# Patient Record
Sex: Female | Born: 1935 | Race: Black or African American | Hispanic: No
Health system: Southern US, Community
[De-identification: ages and names within clinical notes are randomized; demographics above are authoritative.]

---

## 2002-07-02 ENCOUNTER — Emergency Department (HOSPITAL_COMMUNITY): Admission: EM | Admit: 2002-07-02 | Discharge: 2002-07-02 | Payer: Self-pay | Admitting: *Deleted

## 2002-07-02 ENCOUNTER — Encounter: Payer: Self-pay | Admitting: *Deleted

## 2002-07-07 ENCOUNTER — Inpatient Hospital Stay (HOSPITAL_COMMUNITY): Admission: EM | Admit: 2002-07-07 | Discharge: 2002-07-10 | Payer: Self-pay | Admitting: Emergency Medicine

## 2002-07-07 ENCOUNTER — Encounter: Payer: Self-pay | Admitting: Emergency Medicine

## 2003-01-08 ENCOUNTER — Ambulatory Visit (HOSPITAL_COMMUNITY): Admission: RE | Admit: 2003-01-08 | Discharge: 2003-01-08 | Payer: Self-pay | Admitting: Internal Medicine

## 2003-11-07 ENCOUNTER — Encounter (HOSPITAL_COMMUNITY): Admission: RE | Admit: 2003-11-07 | Discharge: 2003-12-07 | Payer: Self-pay | Admitting: Internal Medicine

## 2004-09-01 ENCOUNTER — Emergency Department (HOSPITAL_COMMUNITY): Admission: EM | Admit: 2004-09-01 | Discharge: 2004-09-01 | Payer: Self-pay | Admitting: *Deleted

## 2004-10-15 ENCOUNTER — Ambulatory Visit (HOSPITAL_COMMUNITY): Admission: RE | Admit: 2004-10-15 | Discharge: 2004-10-15 | Payer: Self-pay | Admitting: Internal Medicine

## 2004-12-03 ENCOUNTER — Encounter: Admission: RE | Admit: 2004-12-03 | Discharge: 2004-12-03 | Payer: Self-pay | Admitting: Internal Medicine

## 2005-05-03 ENCOUNTER — Emergency Department (HOSPITAL_COMMUNITY): Admission: EM | Admit: 2005-05-03 | Discharge: 2005-05-04 | Payer: Self-pay | Admitting: Emergency Medicine

## 2006-09-24 ENCOUNTER — Emergency Department (HOSPITAL_COMMUNITY): Admission: EM | Admit: 2006-09-24 | Discharge: 2006-09-24 | Payer: Self-pay | Admitting: Emergency Medicine

## 2006-09-27 ENCOUNTER — Ambulatory Visit: Payer: Self-pay | Admitting: Internal Medicine

## 2006-09-27 ENCOUNTER — Inpatient Hospital Stay (HOSPITAL_COMMUNITY): Admission: EM | Admit: 2006-09-27 | Discharge: 2006-09-30 | Payer: Self-pay | Admitting: Emergency Medicine

## 2006-10-05 ENCOUNTER — Ambulatory Visit (HOSPITAL_COMMUNITY): Admission: RE | Admit: 2006-10-05 | Discharge: 2006-10-05 | Payer: Self-pay | Admitting: Gastroenterology

## 2006-10-05 ENCOUNTER — Encounter: Payer: Self-pay | Admitting: Gastroenterology

## 2006-10-05 ENCOUNTER — Ambulatory Visit: Payer: Self-pay | Admitting: Gastroenterology

## 2008-05-25 IMAGING — CT CT ABDOMEN W/ CM
2 of 6 series · 17 of 46 positions shown, 19 images · IV contrast (omnipaque)
Comparison: None.

CLINICAL DATA: Mid and low abdominal and pelvic pain for approximately six days.  Anemia.  
 ABDOMEN CT WITH CONTRAST:
TECHNIQUE: Multidetector CT imaging of the abdomen was performed following the standard protocol during bolus administration of intravenous contrast.
 Contrast:  120 cc Omnipaque 300.
TECHNIQUE: Multidetector CT imaging of the pelvis was performed following the standard protocol during bolus administration of intravenous contrast.

[Series 3: abd_pel xxl 1.5 b20f · axial · 0.77mm/px · z∈[-408,-10]mm · 14 of 438 slices shown, 16 images]
[im 20/438  soft-tissue]
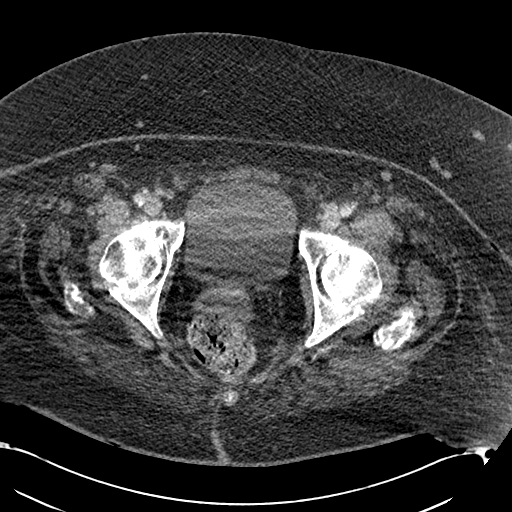
[im 20/438  bone]
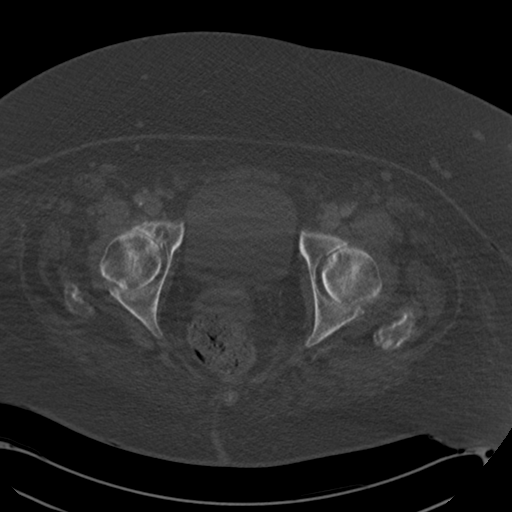
[im 60/438  soft-tissue]
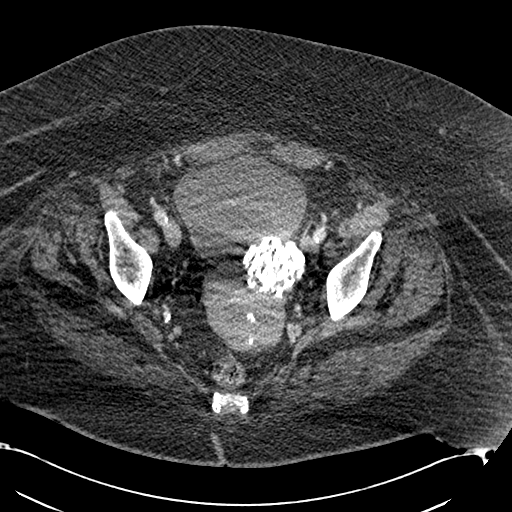
[im 80/438  soft-tissue]
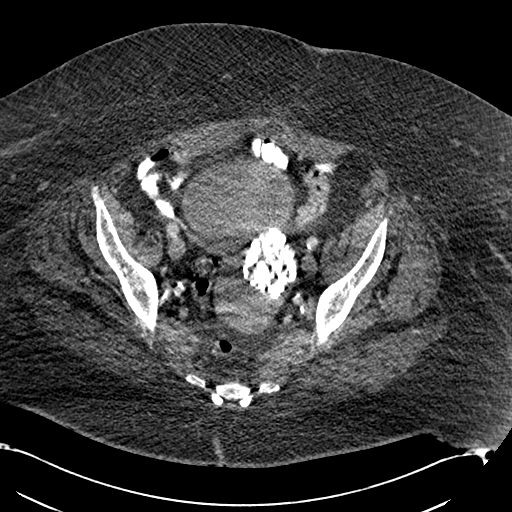
[im 120/438  soft-tissue]
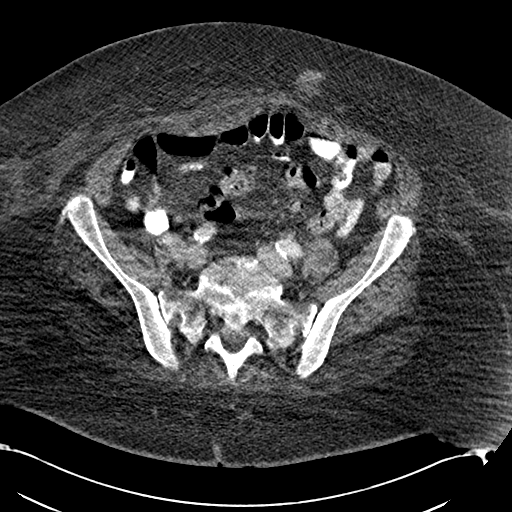
[im 140/438  soft-tissue]
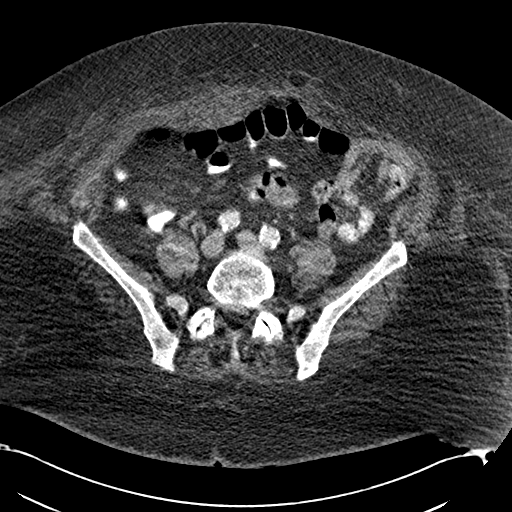
[im 179/438  soft-tissue]
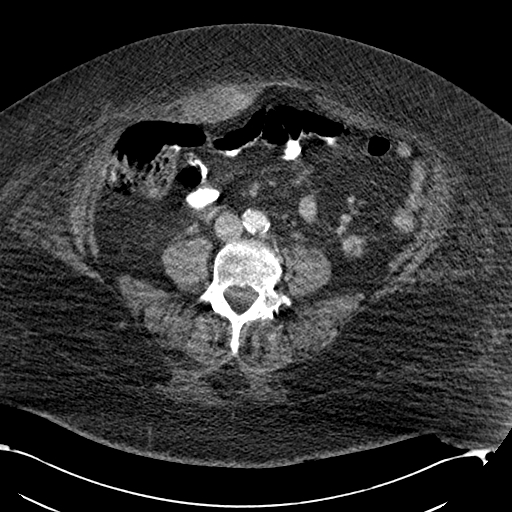
[im 199/438  soft-tissue]
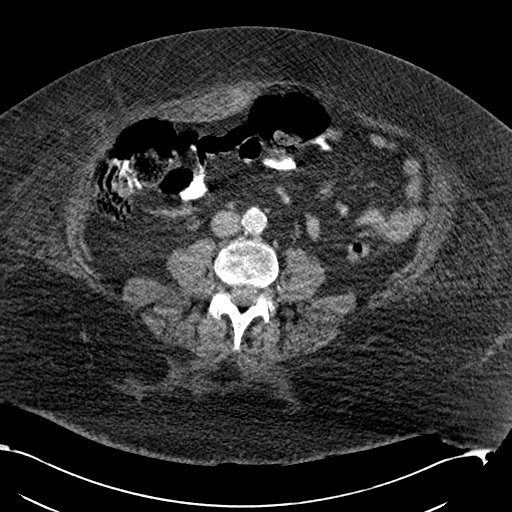
[im 239/438  soft-tissue]
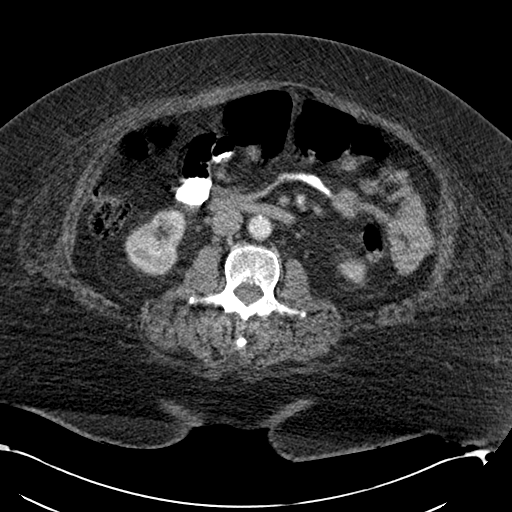
[im 259/438  soft-tissue]
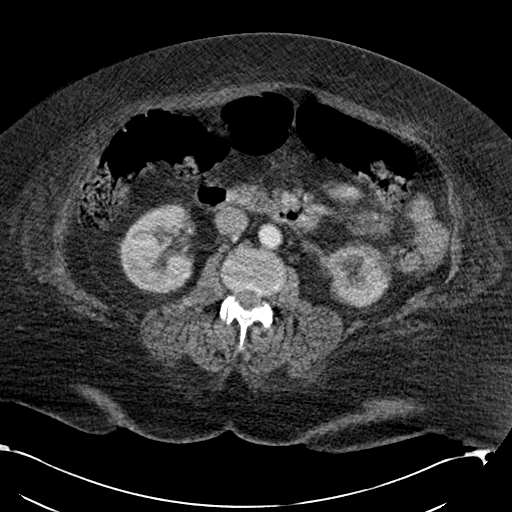
[im 259/438  bone]
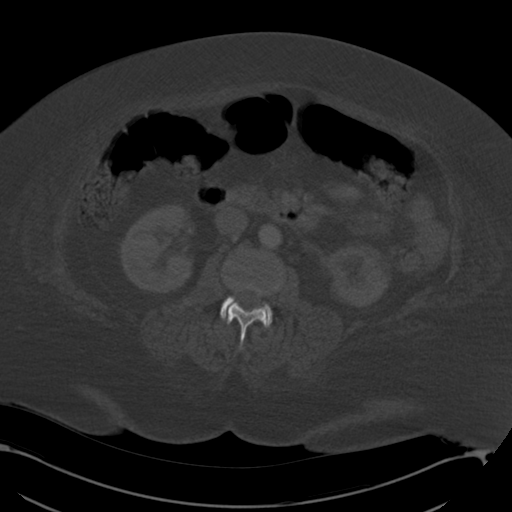
[im 298/438  soft-tissue]
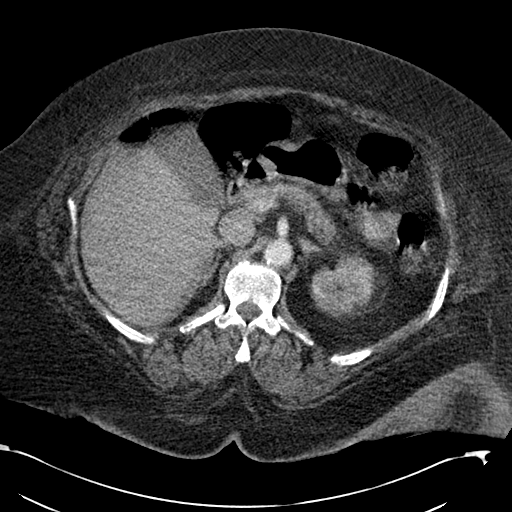
[im 318/438  soft-tissue]
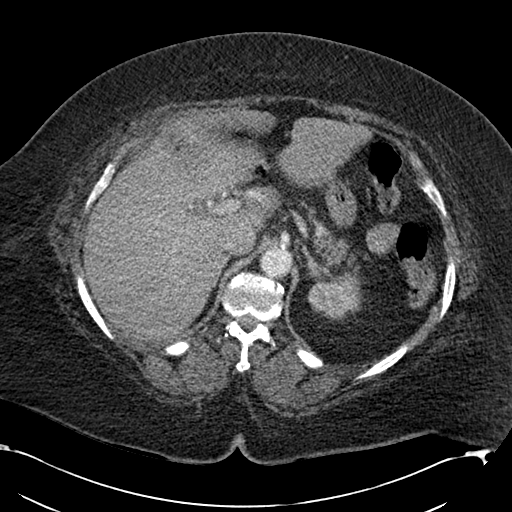
[im 358/438  soft-tissue]
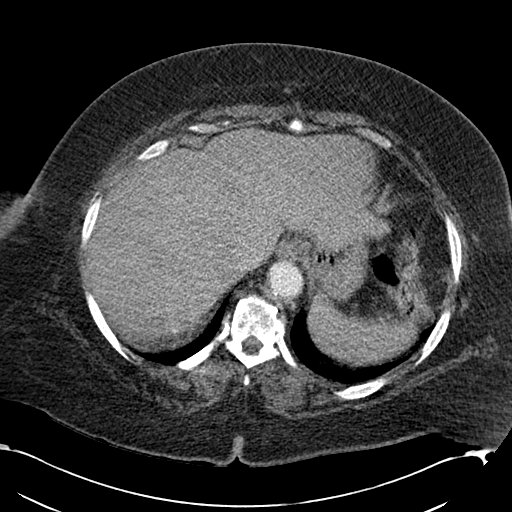
[im 378/438  soft-tissue]
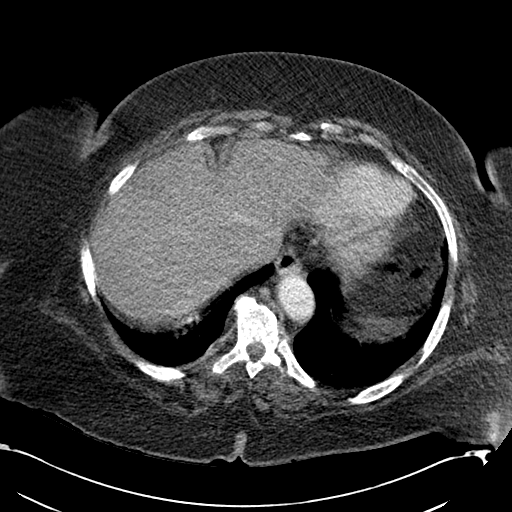
[im 418/438  soft-tissue]
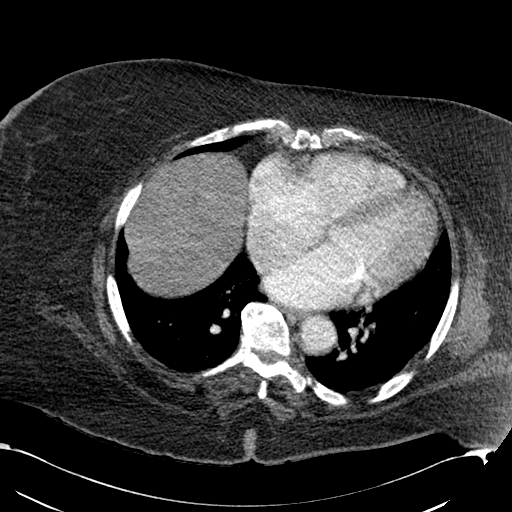

[Series 603: coronal · coronal · 0.86mm/px · 3 of 82 slices shown]
[im 28/82  soft-tissue]
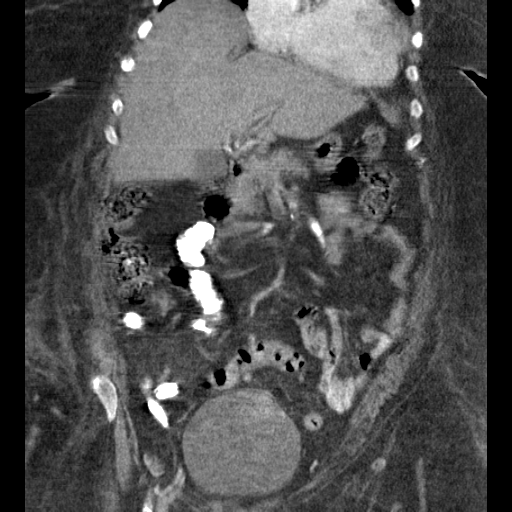
[im 37/82  soft-tissue]
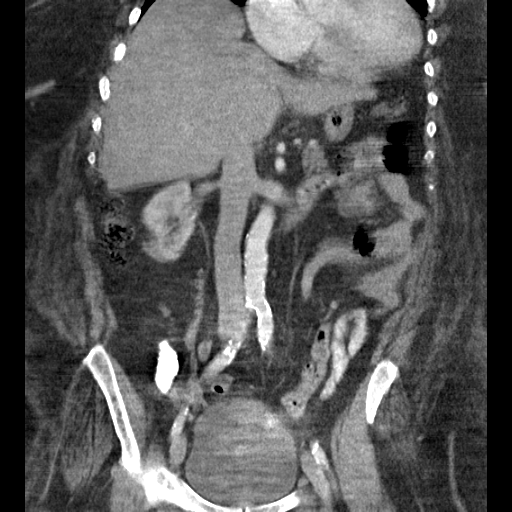
[im 46/82  soft-tissue]
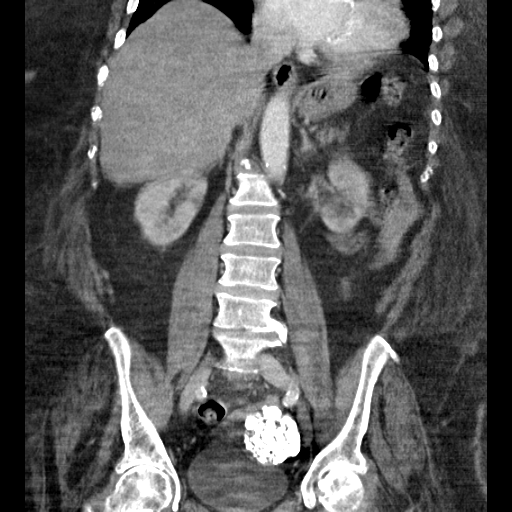

[17 of 46 positions shown; findings below may reference images not displayed]

FINDINGS: This study is limited by the patient?s large body habitus.  Lung bases demonstrate some atelectatic change but are otherwise clear.  No pleural or pericardial effusion.  Heart size is mildly enlarged.  
 The patient has a right rectus sheath hematoma measuring approximately 9.5 cm craniocaudal x 2.8 cm AP x 5.6 cm transverse.  Hematoma demonstrates Hounsfield unit measurements of 52.7.  The liver, gallbladder, spleen, adrenal glands, kidneys, pancreas, gallbladder and biliary tree are all unremarkable.  No abdominal lymphadenopathy.  Scattered atherosclerotic calcifications in the nonaneurysmal descending abdominal aorta is noted.  No abdominal lymphadenopathy.  No focal bony abnormality with degenerative change in the spine.
IMPRESSION: 1. Right rectus sheath hematoma, as described above.
 2. Degenerative disease of the spine.
 PELVIS CT WITH CONTRAST:
FINDINGS: The patient has calcified fibroids of the uterus.  Adnexa are unremarkable.  Diverticular disease of the colon is also noted.  No pelvic lymphadenopathy or fluid collection.  Bones demonstrate severe degenerative change of the hips.
IMPRESSION: 1. No acute finding in the pelvis.
 2. Calcified uterine fibroids.
 3. Degenerative disease of the hips.

## 2010-07-28 NOTE — Consult Note (Signed)
NAME:  Glenda Ellis, Glenda Ellis              ACCOUNT NO.:  [id number]   MEDICAL RECORD NO.:  [id number]          PATIENT TYPE:  INP   LOCATION:  A314                          FACILITY:  [facility]   PHYSICIAN:  Kassie Mends, M.D.      DATE OF BIRTH:  August 13, 1935   DATE OF CONSULTATION:  DATE OF DISCHARGE:                                 CONSULTATION   REPORT TYPE:  GI consultation.   REASON FOR CONSULTATION:  Abdominal pain, anemia, and hemoccult positive  stool.   HISTORY OF PRESENT ILLNESS:  Glenda Ellis is a 75 year old African-  American female with history of multiple medical problems.  She  presented to the hospital with a 6-day history of abdominal pain.  She  describes the pain as constant and severe.  She rates it a 7/10 on pain  scale, is mostly in the right upper quadrant per her report.  It is very  difficult to obtain a history of Glenda Ellis as she appears sedated.  She mumbles frequently and answers my questions with 1 or 2 words,  generally.  Her son is at the bedside during part of the history and  physical, however he states he has little information regarding her  status before being placed in the hospital.  She denies any nausea or  vomiting.  She is having bowel movements every other day.  She denies  any rectal bleeding or melena, denies any heartburn or indigestion,  denies any constipation or diarrhea, denies any dysphagia, odynophagia.  Her weight has remained stable.  She has had anorexia for a week now.  Her son did tell me she had some oropharyngeal edema and a recent  allergic reaction to her anti-hypertensives and this was discontinued  per his report.  She was found to have anemia with a hemoglobin of 8.9  and hematocrit of 27.4 upon admission.  She is scheduled to have a CT  scan of the abdomen and pelvis with IV contrast today.  She has anemia  profile pending.  She has never had a colonoscopy per her report.   PAST MEDICAL/SURGICAL HISTORY:  Hypertension, diabetes  mellitus, CVA and  she has right-sided deficits.  Morbid obesity.  Denies any surgical  history.   CURRENT MEDICATIONS:  1. Lipitor 10 mg daily.  2. Catapres 0.1 mg t.i.d.  3. Zoloft 50 mg daily  4. Avandia 4 mg daily  5. Plavix 75 mg daily  6. Baclofen 10 mg daily  7. Benazepril 20 mg daily  8. Insulin 70/30 17 units subcutaneous in the morning   ALLERGIES:  No known drug allergies.   FAMILY HISTORY:  Noncontributory.   SOCIAL HISTORY:  Glenda Ellis lives alone.  She does have a caregiver.  She denies any tobacco or alcohol or drug use.  Mostly she is restricted  to the bed.  She does occasionally get up with the help of caregiver to  wheelchair.   REVIEW OF SYSTEMS:  See HPI, otherwise negative.   PHYSICAL EXAMINATION:  VITAL SIGNS:  Weight 171 kg, height 67 inches,  temperature 96.8, pulse 64, respirations 20, blood pressure  153/77, O2  saturation 93% on room air.  GENERAL:  Glenda Ellis is a morbidly obese African-American female who  appears drowsy.  She is easily awakened however and dozes off  intermittently throughout the exam.  HEENT:  Sclerae are clear, conjunctiva pink, oropharynx pink and moist  without any lesions.  NECK:  Supple, without thyromegaly.  HEART:  Regular rate and rhythm, normal S1, S2, without any murmurs,  clicks, rubs or gallops.  LUNGS:  Clear to auscultation bilaterally.  ABDOMEN:  Protuberant with positive bowel sounds x4, no bruits  auscultated.  She does have mild tenderness to the epigastrium and right  upper quadrant on deep palpation.  There is no rebound tenderness or  guarding, no hepatosplenomegaly or mass although exam is extremely  limited given the patient's body habitus.  EXTREMITIES:  With 1+ lower extremity edema bilaterally, without any  clubbing.   LABORATORY DATA:  WBC is 8.8, hemoglobin 8.9, hematocrit 27.4, platelets  202, calcium 8.7, sodium 140, potassium 4.2, chloride 105, CO2 29, BUN  34, creatinine 0.92, glucose 169.   LFTs are normal, albumin 3.1.   IMPRESSION:  Glenda Ellis is a morbidly obese female with a 6-day history  of abdominal pain mostly right upper quadrant.  She describes the pain  as constant and severe.  Other than anorexia there are no associated  symptoms.  She is found to have a normocytic anemia with a hemoglobin of  8.9 and she is hemoccult positive.  She is on Plavix putting her at risk  for peptic ulcer disease.  She has an anemia profile pending which will  determine whether we are dealing with an anemia of chronic disease or  iron deficiency.  She has never had a colonoscopy.  There is no evidence  of overt GI bleeding.  Etiology of her abdominal pain is unclear at this  time.  Differentials include gastritis, peptic ulcer disease,  gallbladder etiology, and least likely pancreatitis.  We also have to  keep in mind she could have occult colorectal lesion.   PLAN:  1. We will follow up on anemia profile.  2. Follow up on CT scan of the abdomen and pelvis with IV and oral      contrast that is going to be done today.  3. She will need colonoscopy and EGD in the near future but we will      hold off until further CT scan results are back.  A colonoscopy may      be difficult given her body habitus.  4. CBC in the morning.  5. Agree with Protonix 40 mg daily.  6. Hold Plavix  and will perform endoscopy as an outpatient unless she      has evidence of active bleeding.   We would like to thank Dr. Felecia Shelling for allowing [place] to participate in the  care of Glenda Ellis.      Lorenza Burton, N.P.      Kassie Mends, M.D.  Electronically Signed    KJ/MEDQ  D:  09/27/2006  T:  09/27/2006  Job:  [id number]   cc:   Tesfaye D. Felecia Shelling, MD  Fax: [phone]

## 2010-07-28 NOTE — Op Note (Signed)
NAMEMAGHAN, Glenda              ACCOUNT NO.:  [id number]   MEDICAL RECORD NO.:  [id number]          PATIENT TYPE:  AMB   LOCATION:  DAY                           FACILITY:  [facility]   PHYSICIAN:  Kassie Mends, M.D.      DATE OF BIRTH:  05/24/1935   DATE OF PROCEDURE:  10/05/2006  DATE OF DISCHARGE:                               OPERATIVE REPORT   PROCEDURE:  1. Colonoscopy.  2. Esophagogastroduodenoscopy with cold forceps biopsy.   REFERRING PHYSICIAN:  Tesfaye D. Felecia Shelling, M.D.   INDICATION FOR EXAM:  Glenda Ellis is a 75 year old female who presented  with anemia and heme-positive stool.  Her ferritin was 173.  Her albumin  was 3.1.  While in the hospital, her hemoglobin was maintained at a  level between 8.4 and 9.  She is chronically maintained on Plavix due to  a history of cerebrovascular disease.   FINDINGS:  1. Frequent sigmoid colon diverticulosis.  Otherwise, no polyps,      masses, inflammatory changes or AVMs seen.  Normal retroflexed view      of the rectum.  2. Patent Schatzki's ring.  Otherwise, esophagus without evidence of      mass, erosions or ulcerations.  3. Scattered antral erosions.  No definite ulceration.  Her pylorus      was deformed consistent with prior peptic ulcer disease.  It was      widely patent.  Normal duodenal bulb and second portion of the      duodenum.  Biopsies obtained in the antrum to evaluate for H.      pylori infection.   RECOMMENDATIONS:  1. Glenda Ellis's anemia is likely secondary to chronic disease.  Her      heme positive stools may be attributed to gastritis or hemorrhoids,      which are now resolved.  2. At Prilosec over-the-counter daily.  Restart Plavix on October 09, 2006.  3. No aspirin, NSAIDs or anticoagulation for three days.  4. She should follow a high fiber diet and avoid gastric irritants.      She is given a handout on gastric irritants, gastritis, high fiber      diet, and diverticulosis  5. Will call Ms.  Ellis with the results of her biopsies.  6. Screening colonoscopy in ten years.   MEDICATIONS:  1. Demerol 50 mg IV.  2. Versus at 6 mg IV.   PROCEDURE TECHNIQUE:  Physical exam was performed.  Informed consent was  obtained from the patient explaining the benefits, risks and  alternatives to the procedure.  The patient was connected to the monitor  and placed in the left lateral position.  Continuous oxygen was provided  by nasal cannula and IV medicine administered through an indwelling  cannula.  After administration of sedation and rectal exam, the scope  was advanced under direct visualization to the cecum.  The scope was  removed slowly by carefully examining the color, texture, anatomy and  integrity of the mucosa on the way out.   After the colonoscopy, the patient's esophagus was intubated with  the  diagnostic gastroscope and advanced under direct visualization to the  second portion of the duodenum.  The scope was removed slowly by  carefully examining the color, texture, anatomy, and integrity of the  mucosa on the way out.  The patient was recovered in endoscopy and  discharged home in satisfactory condition.      Kassie Mends, M.D.  Electronically Signed     SM/MEDQ  D:  10/05/2006  T:  10/05/2006  Job:  [id number]   cc:   Tesfaye D. Felecia Shelling, MD  Fax: [phone]

## 2010-07-28 NOTE — H&P (Signed)
NAME:  Glenda Ellis, Glenda Ellis              ACCOUNT NO.:  [id number]   MEDICAL RECORD NO.:  [id number]          PATIENT TYPE:  INP   LOCATION:  A325                          FACILITY:  [facility]   PHYSICIAN:  Tesfaye D. Felecia Shelling, MD   DATE OF BIRTH:  12-26-1935   DATE OF ADMISSION:  09/27/2006  DATE OF DISCHARGE:  LH                              HISTORY & PHYSICAL   CHIEF COMPLAINT:  Abdominal pain.   HISTORY OF PRESENT ILLNESS:  This is a 75 year old female patient with a  history of multiple medical illnesses, who was brought to the emergency  room with the above complaint.  The patient claims she developed  abdominal pain about 3 days back.  She has associated generalized  weakness and difficulty in ambulating.  She continued to have worsening  of the symptoms.  The patient was unable to take her oral feeding.  She  became more weak.  She was brought to the emergency room and evaluated.  The patient was admitted for further evaluation.   REVIEW OF SYSTEMS:  No fever, chills, cough, chest pain, palpitation,  vomiting, hematemesis, dysuria, urgency, frequency or urination.   PAST MEDICAL HISTORY:  1. Hypertension.  2. Diabetes mellitus.  3. CVA and status post right-sided hemiplegia.  4. Morbid obesity.   CURRENT MEDICATIONS:  1. Lipitor 10 mg p.o. daily.  2. Catapres 0.1 mg t.i.d.  3. Zoloft 50 mg daily.  4. Avandia 4 mg daily.  5. Plavix 75 mg daily.  6. Baclofen 10 mg p.o. daily.  7. Benazepril 20 mg p.o. daily.  8. Insulin 70/30 -- seventeen units subcu in a.m.   SOCIAL HISTORY:  The patient lives at home with her family.  No history  of alcohol, tobacco or substance abuse.   PHYSICAL EXAMINATION:  GENERAL:  The patient is alert, awake,  chronically sick-looking, obese.  VITALS:  Blood pressure 150/64, pulse 68, respiratory rate 20 and  temperature 98 degrees Fahrenheit.  HEENT:  Pupils are equal and reactive.  NECK:  Supple.  CHEST:  Clear lung fields.  Good air entry.  CARDIOVASCULAR:  First and second heart sounds heard.  No murmur, no  gallop.  ABDOMEN:  Massively obese.  Bowel sounds are positive.  There is mild  right upper quadrant tenderness, no mass or organomegaly.  EXTREMITIES:  No leg edema.   LABORATORY AND ACCESSORY CLINICAL DATA:  BMP:  Sodium 140, potassium  4.2, chloride 105, carbon dioxide 29, glucose 169, BUN 34, creatinine  0.9, total bilirubin 0.6, alkaline phosphatase 70, AST 20, ALT 17, total  protein 6.9, albumin 3.1, calcium 8.1.  CBC:  WBC 8.3, hemoglobin 8.9,  hematocrit 27.6, platelets 206,000.   ASSESSMENT:  1. Abdominal pain, etiology not clear.  2. Anemia, probably secondary to chronic gastrointestinal blood loss.  3. Hypertension.  4. Diabetes mellitus.  5. Morbid obesity.  6. Cerebrovascular accident and status post right hemiplegia.   PLAN:  We will start the patient on a clear liquid diet.  We will do a  CT scan of the abdomen.  We will gradually rehydrate the patient with  1/2  normal saline at 50 mL per hour.  We will do anemia profile.  We  will do GI consult and we will continue the patient on her regular  medications.      Tesfaye D. Felecia Shelling, MD  Electronically Signed     TDF/MEDQ  D:  09/27/2006  T:  09/27/2006  Job:  [id number]

## 2010-07-31 NOTE — Discharge Summary (Signed)
NAME:  Glenda Ellis, Glenda Ellis              ACCOUNT NO.:  [id number]   MEDICAL RECORD NO.:  [id number]          PATIENT TYPE:  INP   LOCATION:  A314                          FACILITY:  [facility]   PHYSICIAN:  Tesfaye D. Felecia Shelling, MD   DATE OF BIRTH:  Aug 05, 1935   DATE OF ADMISSION:  09/27/2006  DATE OF DISCHARGE:  07/18/2008LH                               DISCHARGE SUMMARY   DISCHARGE DIAGNOSES:  1. Abdominal pain secondary to rectus sheath hematoma.  2. Anemia secondary to chronic gastrointestinal bleed.  3. Diabetes mellitus.  4. Status post cerebrovascular accident.  5. Hypertension.  6. Morbid obesity.   DISCHARGE MEDICATIONS:  1. Protonix 40 mg p.o. daily.  2. Lipitor 10 mg p.o. daily.  3. Catapres 0.1 mg t.i.d.  4. Zoloft 50 mg daily.  5. Avandia 4 mg daily.  6. Baclofen 10 mg daily.  7. Benazepril 20 mg daily.  8. Insulin 70/30 17 units subcu daily.   DISPOSITION:  The patient was discharged to home in stable condition.   HOSPITAL COURSE:  This is a 75 year old female patient with a history of  multiple medical illnesses who was admitted through emergency room due  to abdominal pain.  Her blood tests showed significant anemia.  The  patient had a CT scan of the abdomen which showed right rectus sheath  hematoma.  Her stool guaiac was positive.  The patient was admitted and  treated with pain management.  GI consult was done and the patient was  evaluated.  The patient's condition improved and she was discharged and  with a plan to do the EGD and colonoscopy as an outpatient.      Tesfaye D. Felecia Shelling, MD  Electronically Signed     TDF/MEDQ  D:  10/25/2006  T:  10/26/2006  Job:  [id number]

## 2010-07-31 NOTE — H&P (Signed)
NAME:  BRIER, FIREBAUGH                         ACCOUNT NO.:  [id number]   MEDICAL RECORD NO.:  [id number]                   PATIENT TYPE:  INP   LOCATION:  A340                                 FACILITY:  [facility]   PHYSICIAN:  Tesfaye D. Felecia Shelling, M.D.              DATE OF BIRTH:  1935-06-28   DATE OF ADMISSION:  07/07/2002  DATE OF DISCHARGE:                                HISTORY & PHYSICAL   CHIEF COMPLAINT:  Diarrhea, cough, and generalized pain.   HISTORY OF PRESENT ILLNESS:  The patient is a 75 year old white female with  a history of multiple medical illnesses who came to emergency room with  above complaints.  The patient had symptoms of upper respiratory tract  infection and bronchitis for the last 3 weeks.  She was seen in my office  two times and also in the emergency room once.  She was given symptomatic  treatment and antibiotics.  However, the patient still complains that her  cough continued to persist.  In the last 2 days, she developed frequent  watery diarrhea.  The patient has also abdominal and generalized body aching  pain.  She is very tearful and complains that she is getting weaker and  unable to take care of herself at home.  She also claims that she has a low-  grade fever.   REVIEW OF SYSTEMS:  No headache or shortness of breath.  No dysuria,  urgency, or frequency of urination.  The patient has intermittent shortness  of breath, recurrent cough and diarrhea.  No nausea or vomiting.   PAST MEDICAL HISTORY:  1. Diabetes mellitus type 2.  2. Hypertension.  3. Status post CVA.  4. Morbid obesity.  5. Anxiety-depression disorder.   CURRENT MEDICATIONS:  1. Lipitor 10 mg p.o. once daily.  2. Catapres 0.1 mg p.o. three times daily.  3. Zoloft 50 mg p.o. once daily.  4. Avandia 4 mg p.o. once daily.  5. Plavix 75 mg p.o. once daily.  6. Baclofen 10 mg p.o. b.i.d.   PERSONAL AND SOCIAL HISTORY:  The patient is currently disabled due to her  illness.  She  lives alone by herself.  Denies history of alcohol, tobacco,  or substance abuse.   PHYSICAL EXAMINATION:  GENERAL:  The patient is alert, awake, obese, and  sick looking.  VITALS:  Blood pressure 180/80, pulse 75, respiratory rate 20, temperature  99.4 degrees Fahrenheit.  HEENT:  Pupils are equally reactive.  NECK:  Supple.  CHEST:  Decreased air entry, bilateral rhonchi.  CARDIOVASCULAR:  First and second heart sounds heard.  No murmur.  No  gallop.  ABDOMEN:  Soft, relaxed, bowel sounds are positive.  No mass.  No  organomegaly.  EXTREMITIES:  One plus leg edema.   ASSESSMENT:  1. Diarrhea, etiology unclear, rule out Clostridium difficile colitis.  2. Chronic cough, probably secondary to bronchitis.  3. Hypertension.  4. Diabetes mellitus.  5. Status post cerebrovascular accident.  6. Morbid obesity.   PLAN:  I will start the patient on IV fluids normal saline at 75 mL/hr.  I  will do a stool workup.  We will start the patient on nebulizer treatment.  We will continue regular treatment.                                               Tesfaye D. Felecia Shelling, M.D.    TDF/MEDQ  D:  07/08/2002  T:  07/08/2002  Job:  [id number]

## 2010-07-31 NOTE — Discharge Summary (Signed)
   NAME:  Glenda Ellis, Glenda Ellis                         ACCOUNT NO.:  [id number]   MEDICAL RECORD NO.:  [id number]                   PATIENT TYPE:  INP   LOCATION:  A340                                 FACILITY:  [facility]   PHYSICIAN:  Tesfaye D. Felecia Shelling, M.D.              DATE OF BIRTH:  12-19-1935   DATE OF ADMISSION:  07/07/2002  DATE OF DISCHARGE:  07/10/2002                                 DISCHARGE SUMMARY   DISCHARGE DIAGNOSES:  1. Gastroenteritis.  2. Bronchitis.  3. Hypertension.  4. Diabetes mellitus.  5. Status post cerebrovascular accident.  6. Morbid obesity.   DISCHARGE MEDICATIONS:  1. Lexapro 10 mg p.o. daily.  2. Catapres 0.1 mg p.o. t.i.d.  3. Zoloft 50 mg p.o. daily.  4. Avandia 4 mg p.o. daily.  5. Plavix 75 mg p.o. daily.  6. ________ 10 mg p.o. b.i.d.   DISPOSITION:  Patient was discharged to home in a stable condition.   HOSPITAL COURSE:  This is a 75 year old female with a history of multiple  medical illnesses who was admitted due to diarrhea, coughing, a generalized  state of weakness.  Patient had a cough for over three weeks.  She was seen  in the office as well as in the emergency room.  She was given antibiotics  and cough medications; however, she became more weak and started having  abdominal pain, nausea, and diarrhea.  She was unable to take any oral  feeding.  She was then admitted and started on IV fluids.  She was continued  on nebulizer treatments and symptomatic treatment.  Over the hospital stay,  the patient gradually improved, and then she was discharged back home in  stable condition to be followed in the office.                                               Tesfaye D. Felecia Shelling, M.D.    TDF/MEDQ  D:  07/19/2002  T:  07/19/2002  Job:  [id number]

## 2010-12-28 LAB — CBC
HCT: 25.8 — ABNORMAL LOW
HCT: 26.7 — ABNORMAL LOW
HCT: 27.4 — ABNORMAL LOW
Hemoglobin: 8.5 — ABNORMAL LOW
Hemoglobin: 8.9 — ABNORMAL LOW
Hemoglobin: 8.9 — ABNORMAL LOW
MCHC: 32.6
MCHC: 33
MCHC: 33.3
MCV: 88.6
MCV: 88.8
MCV: 88.8
Platelets: 188
Platelets: 197
Platelets: 202
RBC: 2.9 — ABNORMAL LOW
RBC: 3.01 — ABNORMAL LOW
RBC: 3.09 — ABNORMAL LOW
RDW: 14.6 — ABNORMAL HIGH
RDW: 15.1 — ABNORMAL HIGH
RDW: 15.9 — ABNORMAL HIGH
WBC: 6.7
WBC: 7.1
WBC: 8.3

## 2010-12-28 LAB — DIFFERENTIAL
Basophils Absolute: 0
Basophils Absolute: 0
Basophils Absolute: 0
Basophils Relative: 0
Basophils Relative: 0
Basophils Relative: 0
Eosinophils Absolute: 0.2
Eosinophils Absolute: 0.2
Eosinophils Absolute: 0.3
Eosinophils Relative: 2
Eosinophils Relative: 3
Eosinophils Relative: 4
Lymphocytes Relative: 14
Lymphocytes Relative: 14
Lymphocytes Relative: 21
Lymphs Abs: 1
Lymphs Abs: 1.2
Lymphs Abs: 1.4
Monocytes Absolute: 0.1 — ABNORMAL LOW
Monocytes Absolute: 0.5
Monocytes Absolute: 0.7
Monocytes Relative: 2 — ABNORMAL LOW
Monocytes Relative: 7
Monocytes Relative: 8
Neutro Abs: 4.9
Neutro Abs: 5.3
Neutro Abs: 6.2
Neutrophils Relative %: 74
Neutrophils Relative %: 75
Neutrophils Relative %: 75

## 2010-12-28 LAB — COMPREHENSIVE METABOLIC PANEL
ALT: 17
AST: 20
Albumin: 3.1 — ABNORMAL LOW
Alkaline Phosphatase: 70
BUN: 34 — ABNORMAL HIGH
CO2: 29
Calcium: 8.7
Chloride: 105
Creatinine, Ser: 0.92
GFR calc Af Amer: >60
GFR calc non Af Amer: >60
Glucose, Bld: 169 — ABNORMAL HIGH
Potassium: 4.2
Sodium: 140
Total Bilirubin: 0.6
Total Protein: 6.9

## 2010-12-28 LAB — BASIC METABOLIC PANEL
BUN: 21
CO2: 30
Calcium: 8.5
Chloride: 107
Creatinine, Ser: 0.85
GFR calc Af Amer: >60
GFR calc non Af Amer: >60
Glucose, Bld: 155 — ABNORMAL HIGH
Potassium: 4.6
Sodium: 141

## 2010-12-28 LAB — LIPASE, BLOOD: Lipase: 19

## 2010-12-28 LAB — FOLATE RBC: RBC Folate: 529

## 2010-12-28 LAB — IRON AND TIBC
Iron: 52
Saturation Ratios: 19 — ABNORMAL LOW
TIBC: 272
UIBC: 220

## 2010-12-28 LAB — FERRITIN: Ferritin: 173 (ref 10–291)

## 2010-12-28 LAB — VITAMIN B12: Vitamin B-12: 386 (ref 211–911)

## 2010-12-28 LAB — HEMOGLOBIN AND HEMATOCRIT, BLOOD
HCT: 27.2 — ABNORMAL LOW
Hemoglobin: 9 — ABNORMAL LOW

## 2010-12-28 LAB — AMYLASE: Amylase: 25 — ABNORMAL LOW

## 2016-08-12 ENCOUNTER — Telehealth: Payer: Self-pay | Admitting: Gastroenterology

## 2016-08-12 ENCOUNTER — Encounter: Payer: Self-pay | Admitting: Gastroenterology

## 2016-08-12 NOTE — Telephone Encounter (Signed)
Letter mailed

## 2016-08-12 NOTE — Telephone Encounter (Signed)
July RECALL FOR TCS °

## 2016-10-06 ENCOUNTER — Telehealth: Payer: Self-pay

## 2016-10-06 NOTE — Telephone Encounter (Signed)
Letter reminder for her next colonoscopy was returned and could not reach her at the number listed. Emergency contact number is not working.
